# Patient Record
Sex: Male | Born: 1955 | Race: Black or African American | Hispanic: No | Marital: Married
Health system: Southern US, Community
[De-identification: ages and names within clinical notes are randomized; demographics above are authoritative.]

---

## 2004-07-02 ENCOUNTER — Emergency Department: Payer: Self-pay | Admitting: Unknown Physician Specialty

## 2004-07-03 ENCOUNTER — Ambulatory Visit: Payer: Self-pay | Admitting: Unknown Physician Specialty

## 2004-08-19 ENCOUNTER — Ambulatory Visit: Payer: Self-pay | Admitting: Vascular Surgery

## 2004-12-12 ENCOUNTER — Emergency Department: Payer: Self-pay | Admitting: Internal Medicine

## 2005-08-11 ENCOUNTER — Emergency Department: Payer: Self-pay | Admitting: Emergency Medicine

## 2005-09-04 ENCOUNTER — Ambulatory Visit: Payer: Self-pay | Admitting: Gastroenterology

## 2006-09-11 ENCOUNTER — Inpatient Hospital Stay: Payer: Self-pay | Admitting: Internal Medicine

## 2006-09-11 ENCOUNTER — Other Ambulatory Visit: Payer: Self-pay

## 2006-10-15 ENCOUNTER — Ambulatory Visit: Payer: Self-pay | Admitting: Gastroenterology

## 2008-12-15 IMAGING — CT CT ABD-PELV W/O CM
1 of 2 series · 15 of 32 positions shown, 19 images · non-contrast
Comparison: none

REASON FOR EXAM: (1) abd pain, rm 1; (2) abd pain, rm 1
COMMENTS:

PROCEDURE:     CT  - CT ABDOMEN AND PELVIS W[DATE]  [DATE]
RESULT:     Comparison: CT of the abdomen and pelvis on 07/03/2004.
TECHNIQUE: CT examination of the abdomen and pelvis was performed without
contrast. Collimation is 3 mm.

[Series 2: stone · axial · 0.69mm/px · z∈[-496,-80]mm · 15 of 156 slices shown, 19 images]
[im 11/156  soft-tissue]
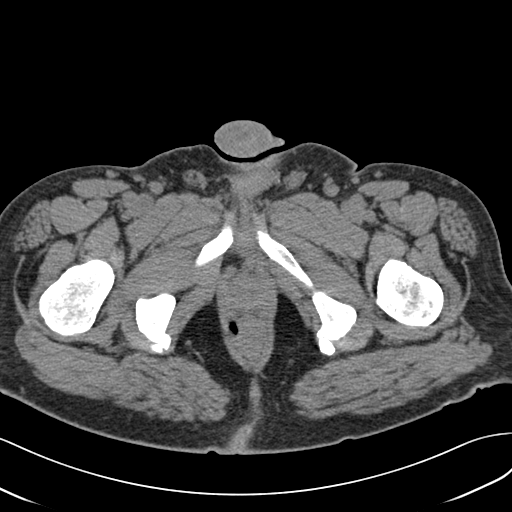
[im 11/156  bone]
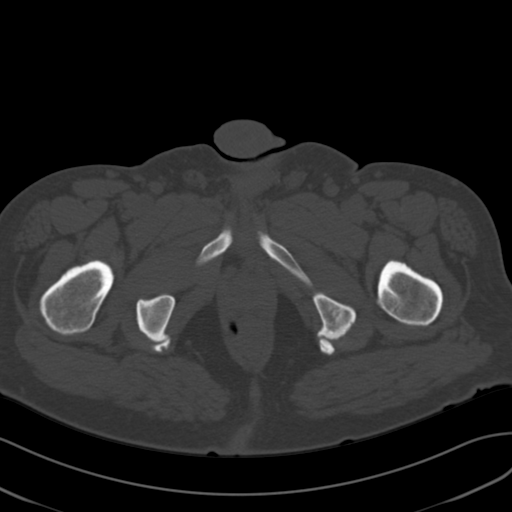
[im 22/156  soft-tissue]
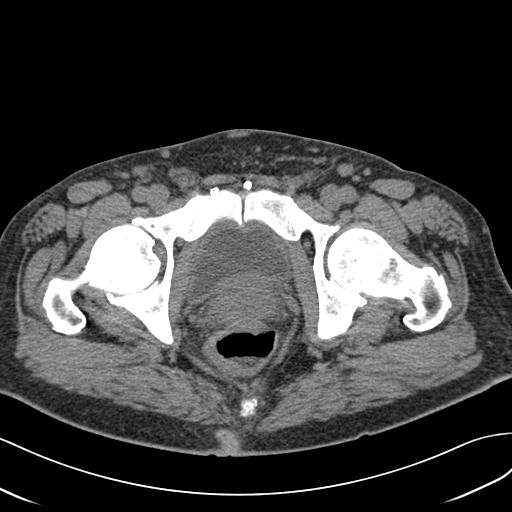
[im 33/156  soft-tissue]
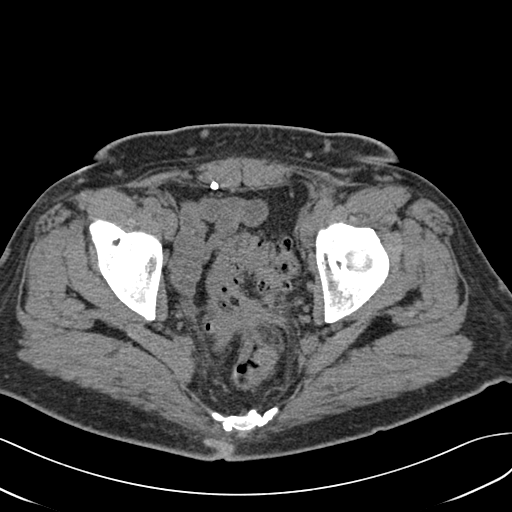
[im 43/156  soft-tissue]
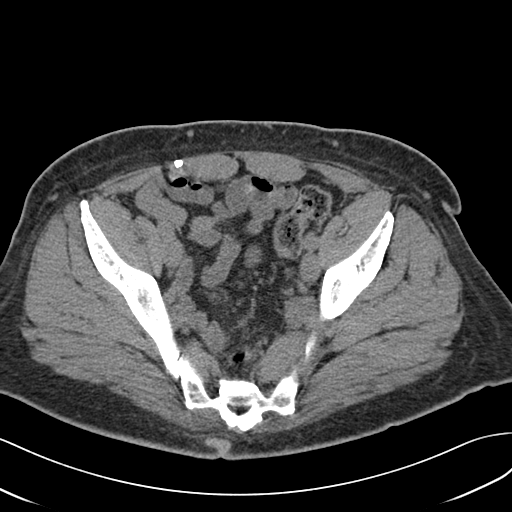
[im 54/156  soft-tissue]
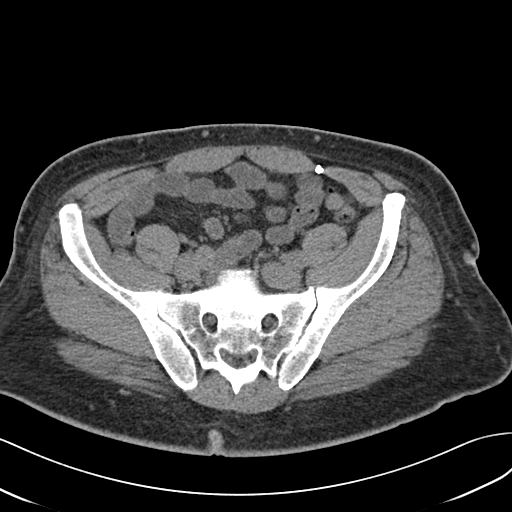
[im 65/156  soft-tissue]
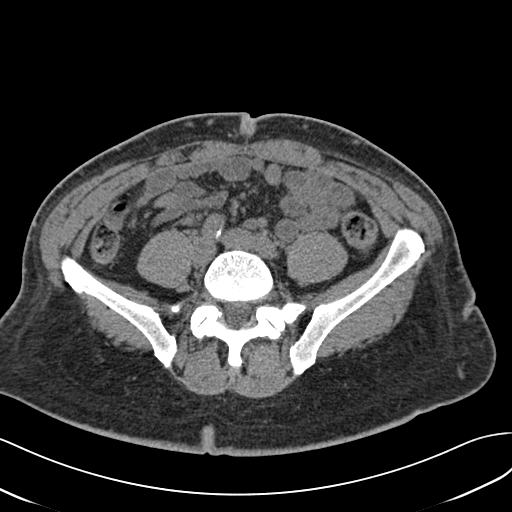
[im 81/156  soft-tissue]
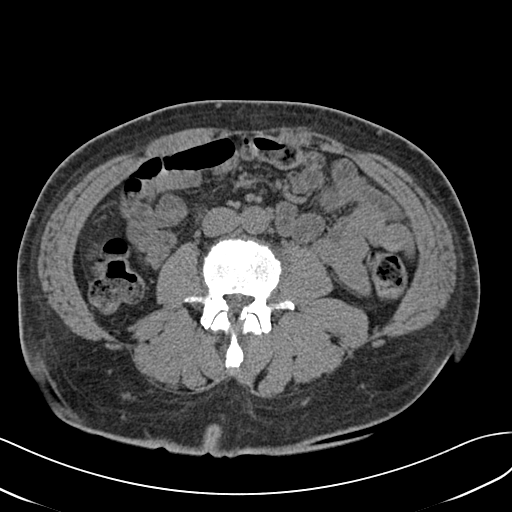
[im 91/156  soft-tissue]
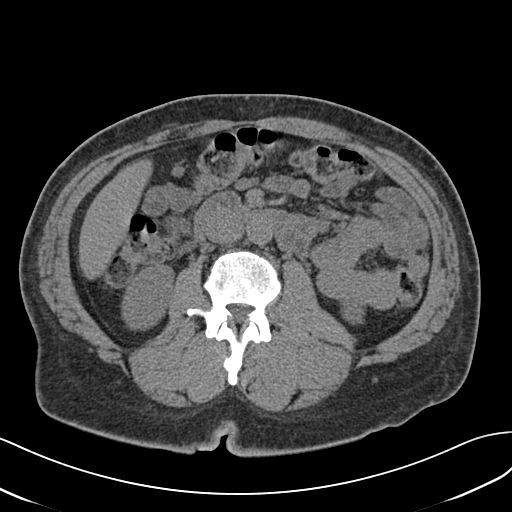
[im 102/156  soft-tissue]
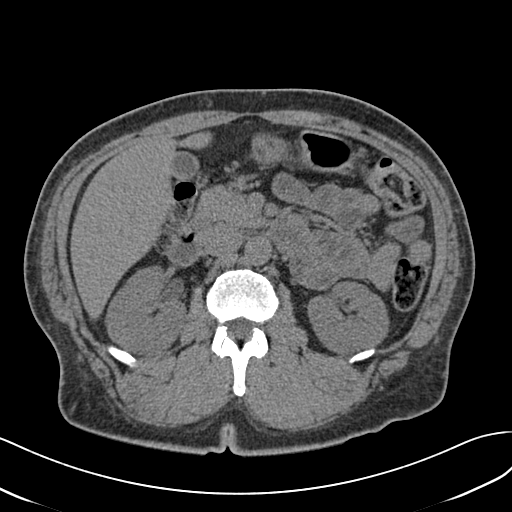
[im 102/156  bone]
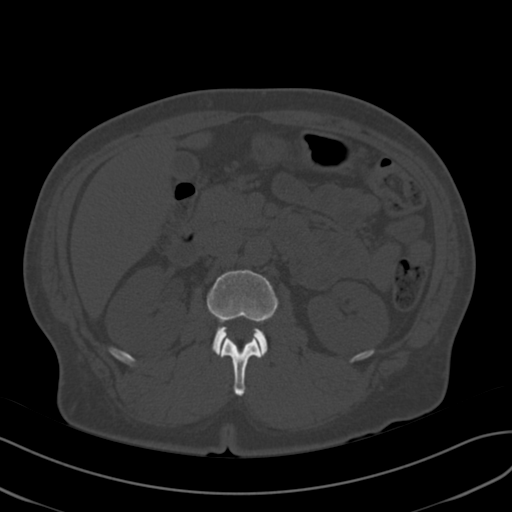
[im 113/156  soft-tissue]
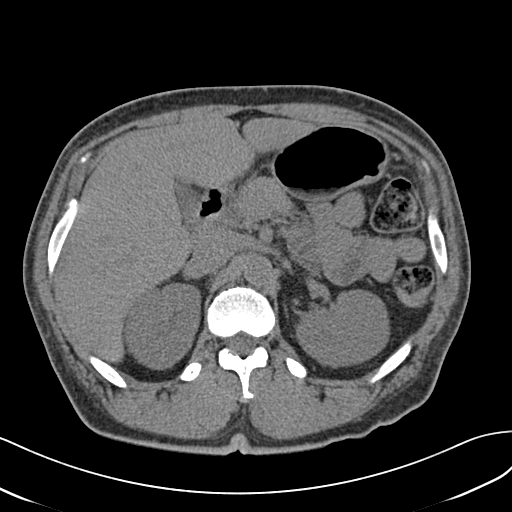
[im 123/156  soft-tissue]
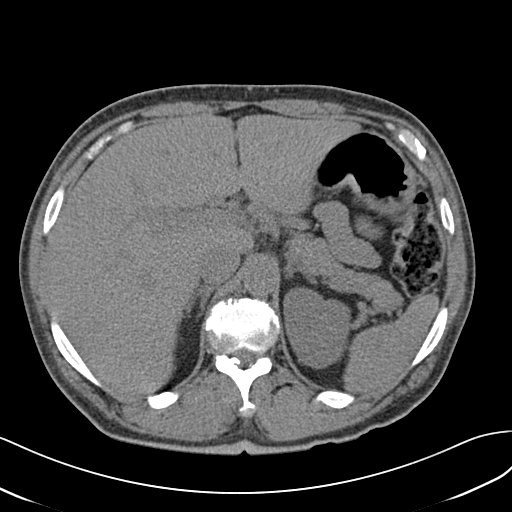
[im 134/156  soft-tissue]
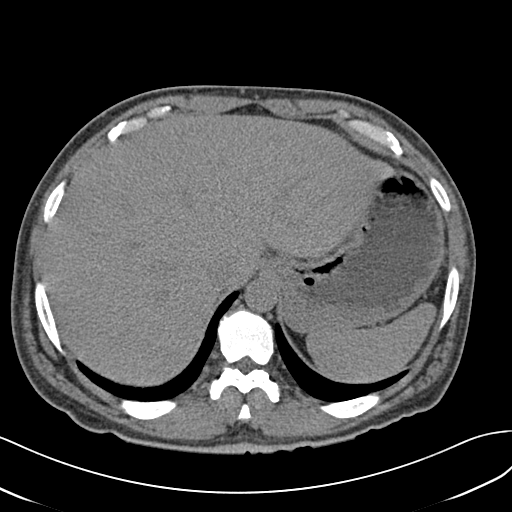
[im 134/156  lung]
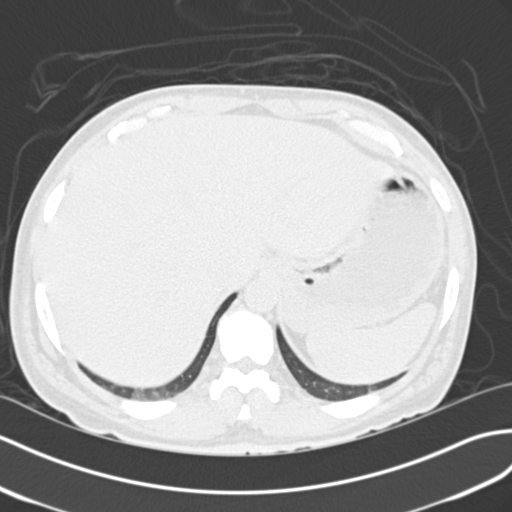
[im 139/156  lung]
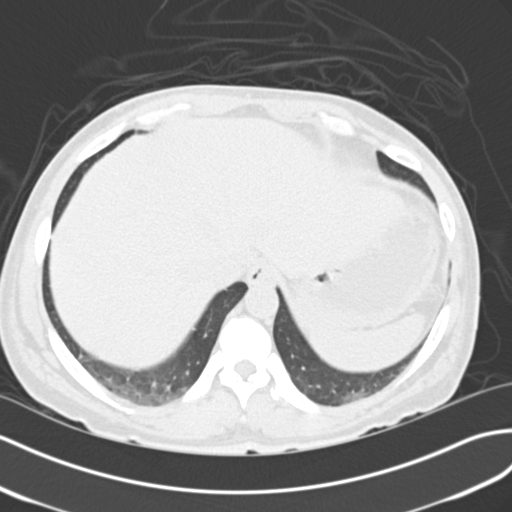
[im 145/156  soft-tissue]
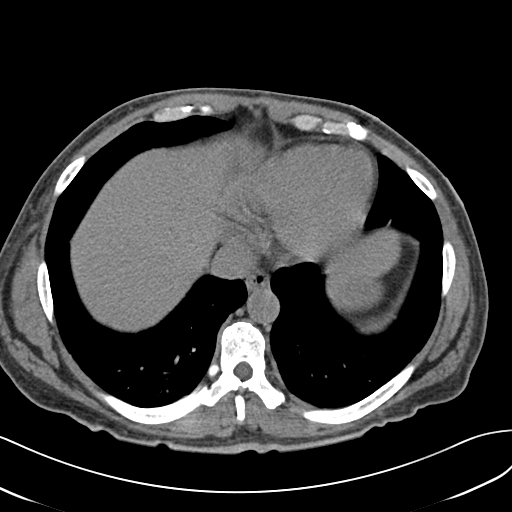
[im 145/156  lung]
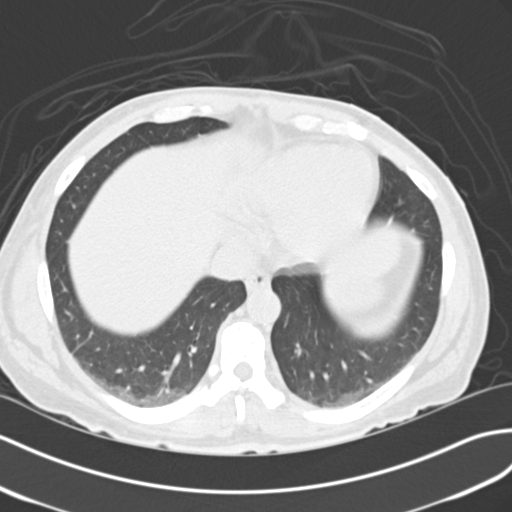
[im 150/156  lung]
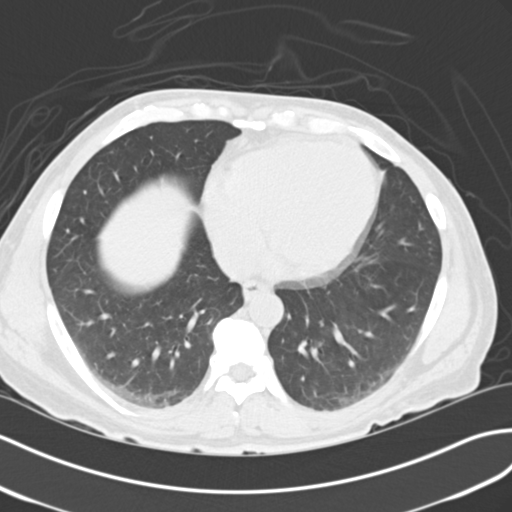

[15 of 32 positions shown; findings below may reference images not displayed]

FINDINGS: Limited evaluation of the lung bases shows minimal bibasilar atelectasis.

Evaluation of the abdominal organs, bowels, and vessels is limited without
contrast. The liver, gallbladder, spleen, pancreas, and adrenal glands are
grossly unremarkable. No renal or ureteral stone is noted. There is no
hydronephrosis.

Colonic diverticulosis is seen. There is fat stranding in the region of the
sigmoid colon. There is probably a punctate focus of extraluminal gas.
Findings are suggestive of diverticulitis with micro perforation. There is
no gross intraperitoneal free air. Presence or absence of an abscess is not
well evaluated without intravenous contrast. There is no dilatation of the
bowels. The appendix is unremarkable.  There are no enlarged abdominal
pelvic lymph nodes. Lower anterior abdominal/pelvic wall mesh is noted. No
ventral or inguinal hernia is seen.
IMPRESSION: 1. Findings are suggestive of sigmoid diverticulitis with microperforation.
Surrounding fat stranding and likely a small amount of complex fluid are
noted. Presence or absence of an abscess is not well evaluated without
intravenous contrast. If patient does not have a recent colonoscopy, would
recommend colonoscopy after acute management. Would also recommend follow
abdomen pelvis CT with contrast one month after acute management to see
resolution of extraluminal pelvic abnormality.

Preliminary report was faxed to the emergency room by the night radiologist
shortly after the study was performed.

## 2013-06-04 ENCOUNTER — Emergency Department: Payer: Self-pay | Admitting: Emergency Medicine

## 2017-07-26 ENCOUNTER — Encounter: Payer: Self-pay | Admitting: Emergency Medicine

## 2017-07-26 ENCOUNTER — Emergency Department: Payer: Self-pay

## 2017-07-26 ENCOUNTER — Emergency Department
Admission: EM | Admit: 2017-07-26 | Discharge: 2017-07-26 | Disposition: A | Discharge status: Home / Self Care | Payer: Self-pay | Attending: Emergency Medicine | Admitting: Emergency Medicine

## 2017-07-26 DIAGNOSIS — T148XXA Other injury of unspecified body region, initial encounter: Secondary | ICD-10-CM

## 2017-07-26 DIAGNOSIS — X509XXA Other and unspecified overexertion or strenuous movements or postures, initial encounter: Secondary | ICD-10-CM | POA: Insufficient documentation

## 2017-07-26 DIAGNOSIS — Y929 Unspecified place or not applicable: Secondary | ICD-10-CM | POA: Insufficient documentation

## 2017-07-26 DIAGNOSIS — M47896 Other spondylosis, lumbar region: Secondary | ICD-10-CM | POA: Insufficient documentation

## 2017-07-26 DIAGNOSIS — S39012A Strain of muscle, fascia and tendon of lower back, initial encounter: Secondary | ICD-10-CM | POA: Insufficient documentation

## 2017-07-26 DIAGNOSIS — Y93H2 Activity, gardening and landscaping: Secondary | ICD-10-CM | POA: Insufficient documentation

## 2017-07-26 DIAGNOSIS — Y998 Other external cause status: Secondary | ICD-10-CM | POA: Insufficient documentation

## 2017-07-26 LAB — URINALYSIS, COMPLETE (UACMP) WITH MICROSCOPIC
BACTERIA UA: NONE SEEN
BILIRUBIN URINE: NEGATIVE
Glucose, UA: NEGATIVE mg/dL
HGB URINE DIPSTICK: NEGATIVE
Ketones, ur: NEGATIVE mg/dL
LEUKOCYTES UA: NEGATIVE
NITRITE: NEGATIVE
PROTEIN: NEGATIVE mg/dL
SQUAMOUS EPITHELIAL / LPF: NONE SEEN (ref 0–5)
Specific Gravity, Urine: 1.014 (ref 1.005–1.030)
pH: 5 (ref 5.0–8.0)

## 2017-07-26 MED ORDER — TRAMADOL HCL 50 MG PO TABS: 50.0000 mg | ORAL_TABLET | Freq: Two times a day (BID) | ORAL | 0 refills | Status: AC | PRN

## 2017-07-26 MED ORDER — KETOROLAC TROMETHAMINE 60 MG/2ML IM SOLN
60.0000 mg | Freq: Once | INTRAMUSCULAR | Status: AC
  Administered 2017-07-26: 60 mg via INTRAMUSCULAR
  Filled 2017-07-26: qty 2

## 2017-07-26 MED ORDER — MELOXICAM 15 MG PO TABS: 15.0000 mg | ORAL_TABLET | Freq: Every day | ORAL | 0 refills | Status: AC

## 2017-07-26 MED ORDER — CYCLOBENZAPRINE HCL 10 MG PO TABS: 10.0000 mg | ORAL_TABLET | Freq: Three times a day (TID) | ORAL | 0 refills | Status: AC | PRN

## 2017-07-26 NOTE — ED Triage Notes (Signed)
Pt reports started with lower back pain on the right side Saturday after he finished weed eating. Pt denies urinary sx's. Denies obvious injuries.

## 2017-07-26 NOTE — ED Notes (Signed)
Pt c/o lower back pain since Saturday, states it started after weedeating..Marland Kitchen

## 2017-07-26 NOTE — Discharge Instructions (Addendum)
Advised elastic lumbar support when performing bending /lifting.

## 2017-07-26 NOTE — ED Provider Notes (Signed)
[facility] Emergency Department Provider Note   ____________________________________________   First MD Initiated Contact with Patient 07/26/17 1408     (approximate)  I have reviewed the triage vital signs and the nursing notes.   HISTORY  Chief Complaint Back Pain    HPI Dominic Stanley is a 62 y.o. male patient complain of right low back pain secondary to [employer] a weed eater 2 days ago.  Patient states pain started after he finished weed eating.  Patient the pain has progressed so that he has problems with sitting and standing.  Patient denies radicular component to his back pain.  Patient denies bladder bowel dysfunction.  No palliative measures for complaint.  Patient rates pain as 10/10.  Patient described the pain is "achy".  History reviewed. No pertinent past medical history.  There are no active problems to display for this patient.   History reviewed. No pertinent surgical history.  Prior to Admission medications   Medication Sig Start Date End Date Taking? Authorizing Provider  cyclobenzaprine (FLEXERIL) 10 MG tablet Take 1 tablet (10 mg total) by mouth 3 (three) times daily as needed. 07/26/17   Joni Reining, PA-C  meloxicam (MOBIC) 15 MG tablet Take 1 tablet (15 mg total) by mouth daily. 07/26/17   Joni Reining, PA-C  traMADol (ULTRAM) 50 MG tablet Take 1 tablet (50 mg total) by mouth every 12 (twelve) hours as needed. 07/26/17   Joni Reining, PA-C    Allergies Patient has no known allergies.  No family history on file.  Social History Social History   Tobacco Use  . Smoking status: Not on file  Substance Use Topics  . Alcohol use: Not on file  . Drug use: Not on file    Review of Systems Constitutional: No fever/chills Eyes: No visual changes. ENT: No sore throat. Cardiovascular: Denies chest pain. Respiratory: Denies shortness of breath. Gastrointestinal: No abdominal pain.  No nausea, no vomiting.  No  diarrhea.  No constipation. Genitourinary: Negative for dysuria. Musculoskeletal: Positive for back pain. Skin: Negative for rash. Neurological: Negative for headaches, focal weakness or numbness.   ____________________________________________   PHYSICAL EXAM:  VITAL SIGNS: ED Triage Vitals  Enc Vitals Group     BP 07/26/17 1349 135/85     Pulse Rate 07/26/17 1349 82     Resp 07/26/17 1349 20     Temp 07/26/17 1350 98.8 F (37.1 C)     Temp Source 07/26/17 1350 Oral     SpO2 07/26/17 1349 98 %     Weight 07/26/17 1349 201 lb (91.2 kg)     Height 07/26/17 1349 5\' 6"  (1.676 m)     Head Circumference --      Peak Flow --      Pain Score 07/26/17 1349 10     Pain Loc --      Pain Edu? --      Excl. in GC? --    Constitutional: Alert and oriented. Well appearing and in no acute distress.  Neck:No cervical spine tenderness to palpation. Cardiovascular: Normal rate, regular rhythm. Grossly normal heart sounds.  Good peripheral circulation. Respiratory: Normal respiratory effort.  No retractions. Lungs CTAB. Gastrointestinal: Soft and nontender. No distention. No abdominal bruits. No CVA tenderness. Genitourinary: Deferred Musculoskeletal: Patient sits and stands with reliance on the upper extremities.  No obvious spinal deformity.  No lower extremity tenderness nor edema. Neurologic:  Normal speech and language. No gross focal neurologic deficits are appreciated.  No gait instability. Skin:  Skin is warm, dry and intact. No rash noted. Psychiatric: Mood and affect are normal. Speech and behavior are normal.  ____________________________________________   LABS (all labs ordered are listed, but only abnormal results are displayed)  Labs Reviewed  URINALYSIS, COMPLETE (UACMP) WITH MICROSCOPIC - Abnormal; Notable for the following components:      Result Value   Color, Urine YELLOW (*)    APPearance CLEAR (*)    All other components within normal limits    ____________________________________________  EKG  EKG read by heart station Dr. with no acute findings. ____________________________________________  RADIOLOGY  ED MD interpretation:  Official radiology report(s): Dg Lumbar Spine 2-3 Views  Result Date: 07/26/2017 CLINICAL DATA:  Right-sided lower back pain EXAM: LUMBAR SPINE - 3 VIEW COMPARISON:  10/15/2006 FINDINGS: Five lumbar type vertebral bodies are well visualized. Mild scoliosis of the lumbar spine is seen. Disc space narrowing is noted at L2-3. Osteophytic changes are noted at all lumbar levels. No definitive anterolisthesis is seen. No soft tissue changes are noted. IMPRESSION: Multilevel degenerative change without acute abnormality. Electronically Signed   By: Alcide CleverMark  Lukens M.D.   On: 07/26/2017 14:50    ____________________________________________   PROCEDURES  Procedure(s) performed: None  Procedures  Critical Care performed: No  ____________________________________________   INITIAL IMPRESSION / ASSESSMENT AND PLAN / ED COURSE  As part of my medical decision making, I reviewed the following data within the electronic MEDICAL RECORD NUMBER    Patient presents with increasing right lateral back pain secondary to yardwork.  Discussed x-ray findings with patient showing moderate degenerative change in the lumbar spine.  Patient given discharge care instruction and prescription for Flexeril, tramadol and meloxicam.  Patient advised to wear elastic lumbar support when performing repetitive lifting and bending.  Advised [facility].      ____________________________________________   FINAL CLINICAL IMPRESSION(S) / ED DIAGNOSES  Final diagnoses:  Muscle strain  Other osteoarthritis of spine, lumbar region     ED Discharge Orders        Ordered    cyclobenzaprine (FLEXERIL) 10 MG tablet  3 times daily PRN     07/26/17 1503    traMADol (ULTRAM) 50 MG tablet   Every 12 hours PRN     07/26/17 1503    meloxicam (MOBIC) 15 MG tablet  Daily     07/26/17 1503       Note:  This document was prepared using Dragon voice recognition software and may include unintentional dictation errors.    Joni ReiningSmith, Tarrah Furuta K, PA-C 07/26/17 1515    Emily FilbertWilliams, Jonathan E, MD 07/27/17 1047

## 2017-07-26 NOTE — ED Notes (Signed)
See triage note  Presents with pain to right lower back  States pain started after weed eating his yard this past weekend.  Pain increases with movement

## 2019-10-30 IMAGING — CR DG LUMBAR SPINE 2-3V
1 series · 3 of 3 positions shown · non-contrast
Comparison: 10/15/2006

CLINICAL DATA: Right-sided lower back pain

EXAM:
LUMBAR SPINE - 3 VIEW

[Series 1: dg lumbar spine 2-3 views · 0.14mm/px · 3 of 3 slices shown]
[im 1/3]
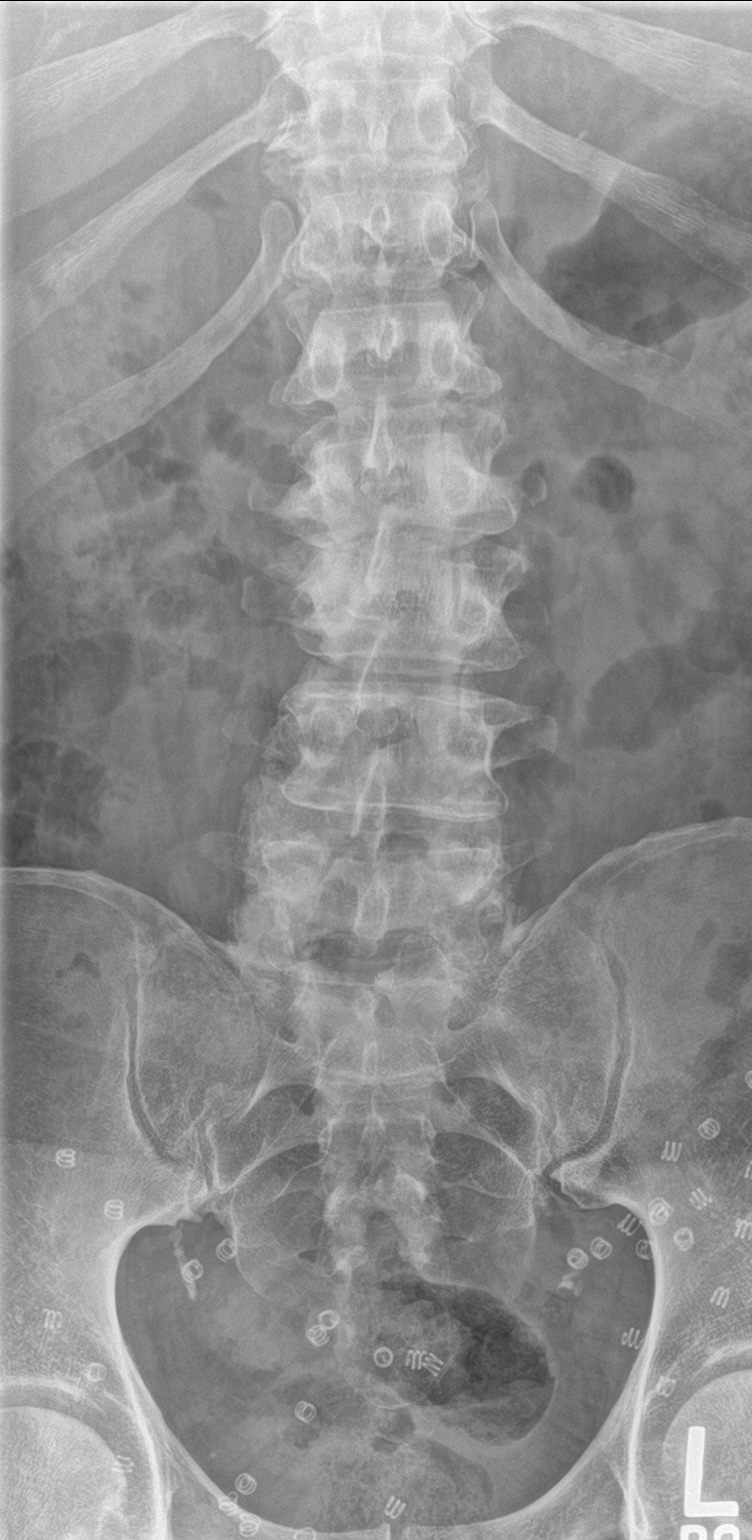
[im 2/3]
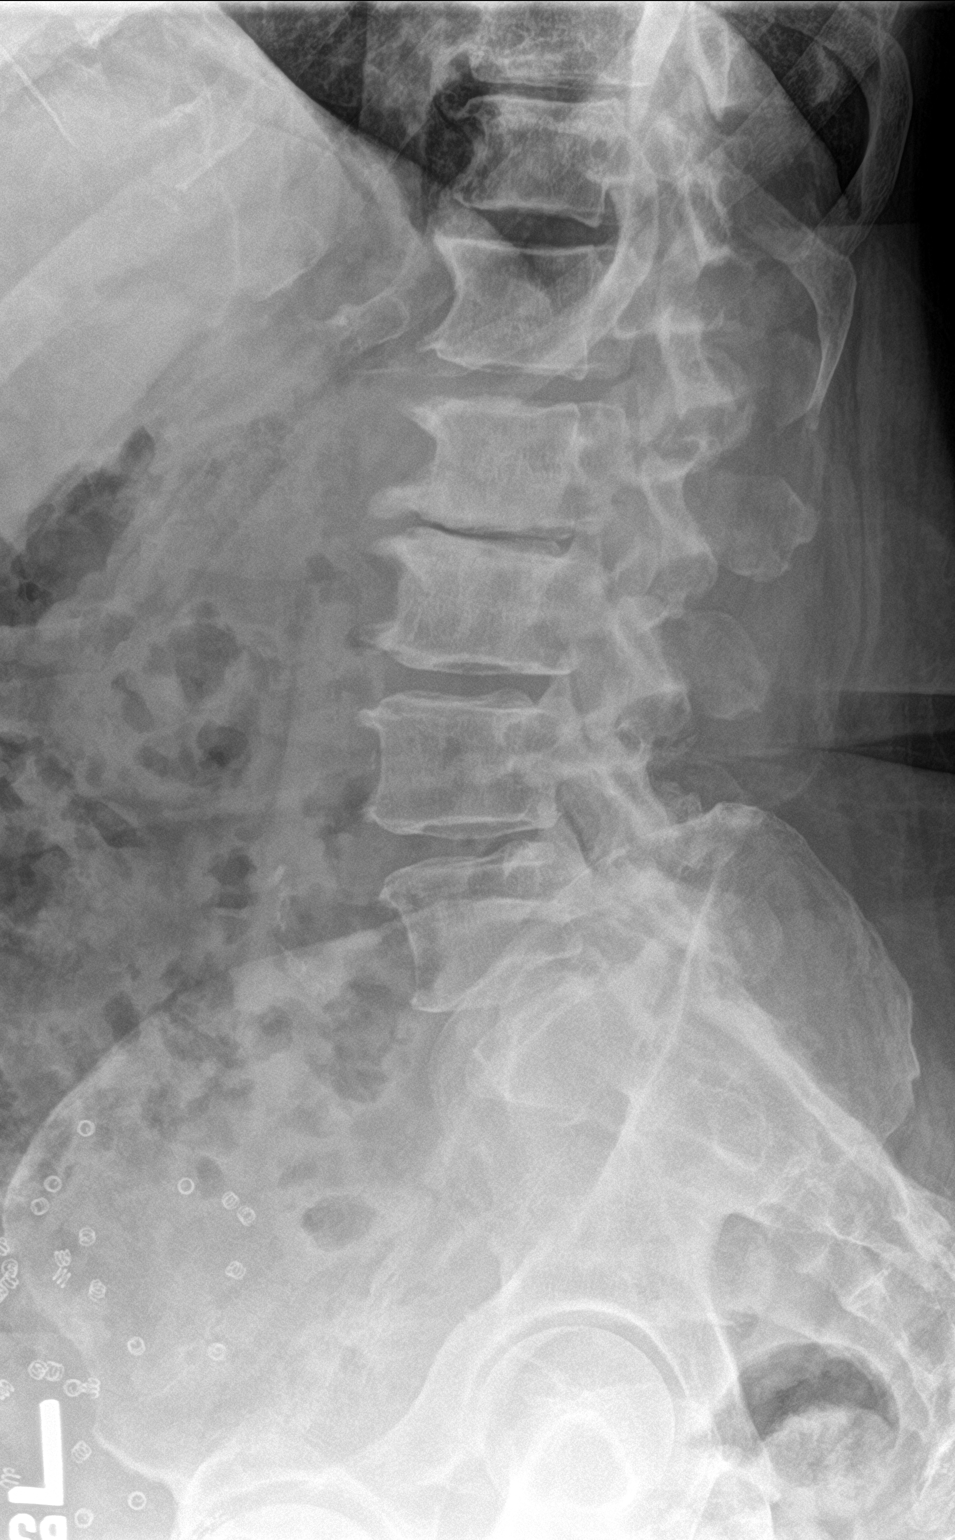
[im 3/3]
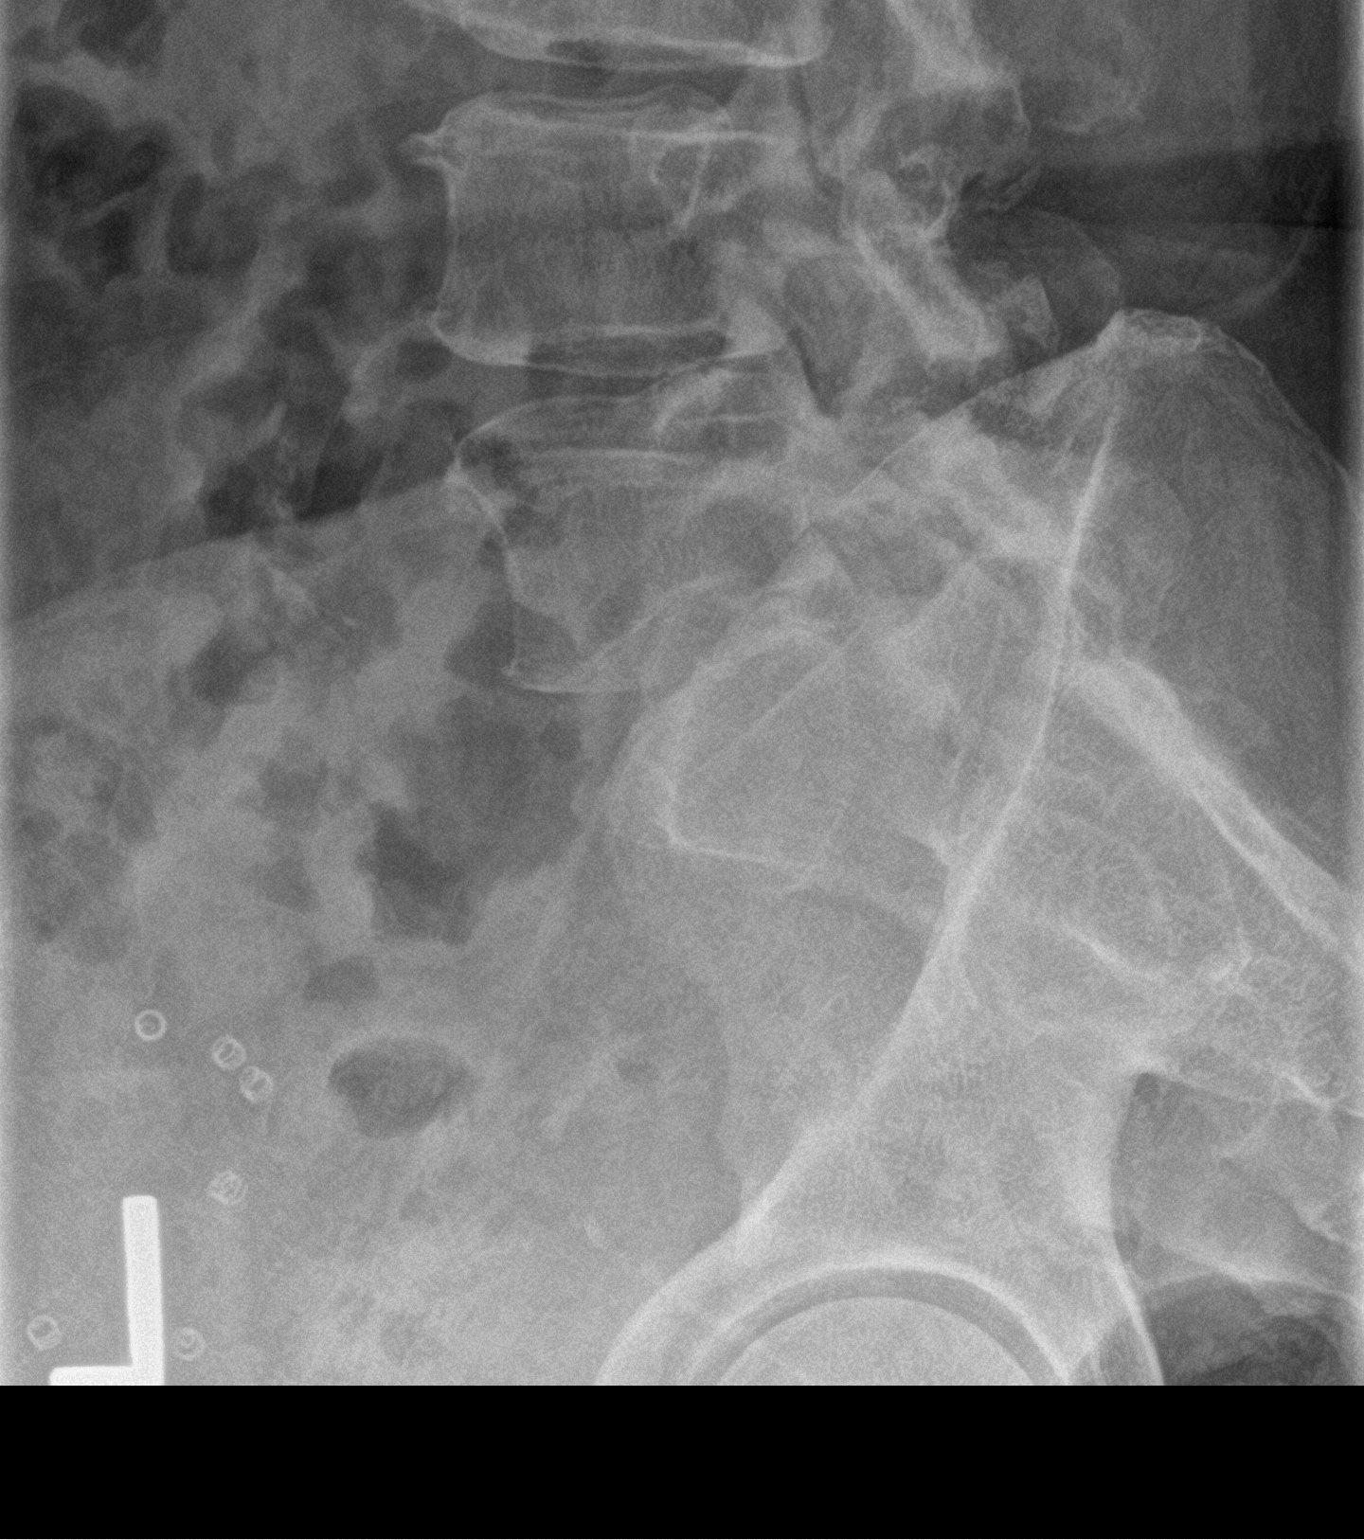

[3 of 3 positions shown; findings below may reference images not displayed]

FINDINGS: Five lumbar type vertebral bodies are well visualized. Mild
scoliosis of the lumbar spine is seen. Disc space narrowing is noted
at L2-3. Osteophytic changes are noted at all lumbar levels. No
definitive anterolisthesis is seen. No soft tissue changes are
noted.
IMPRESSION: Multilevel degenerative change without acute abnormality.

## 2022-01-14 ENCOUNTER — Ambulatory Visit: Payer: Medicare Other | Admitting: Physician Assistant

## 2022-01-14 NOTE — Progress Notes (Deleted)
    I,Sha'taria Kellianne Ek,acting as a Neurosurgeon for [employer], PA-C.,have documented all relevant documentation on the behalf of Debera Lat, PA-C,as directed by  [employer], PA-C while in the presence of [employer], PA-C.    New patient visit   Patient: Dominic Stanley   DOB: [birth year]   66 y.o. Male  MRN: [id number] Visit Date: 01/14/2022  Today's healthcare provider: Debera Lat, PA-C   No chief complaint on file.  Subjective    DELLAS GUARD is a 66 y.o. male who presents today as a new patient to establish care.  HPI  ***  No past medical history on file. No past surgical history on file. No family status information on file.   No family history on file. Social History   Socioeconomic History   Marital status: Married    Spouse name: Not on file   Number of children: Not on file   Years of education: Not on file   Highest education level: Not on file  Occupational History   Not on file  Tobacco Use   Smoking status: Not on file   Smokeless tobacco: Not on file  Substance and Sexual Activity   Alcohol use: Not on file   Drug use: Not on file   Sexual activity: Not on file  Other Topics Concern   Not on file  Social History Narrative   Not on file   Social Determinants of Health   Financial Resource Strain: Not on file  Food Insecurity: Not on file  Transportation Needs: Not on file  Physical Activity: Not on file  Stress: Not on file  Social Connections: Not on file   Outpatient Medications Prior to Visit  Medication Sig   cyclobenzaprine (FLEXERIL) 10 MG tablet Take 1 tablet (10 mg total) by mouth 3 (three) times daily as needed.   meloxicam (MOBIC) 15 MG tablet Take 1 tablet (15 mg total) by mouth daily.   traMADol (ULTRAM) 50 MG tablet Take 1 tablet (50 mg total) by mouth every 12 (twelve) hours as needed.   No facility-administered medications prior to visit.   No Known Allergies   There is no immunization history on file for  this patient.  Health Maintenance  Topic Date Due   Medicare Annual Wellness (AWV)  Never done   COVID-19 Vaccine (1) Never done   HIV Screening  Never done   Hepatitis C Screening  Never done   DTaP/Tdap/Td (1 - Tdap) Never done   COLONOSCOPY (Pts 45-1yrs Insurance coverage will need to be confirmed)  Never done   Zoster Vaccines- Shingrix (1 of 2) Never done   Pneumonia Vaccine 41+ Years old (1 - PCV) Never done   INFLUENZA VACCINE  Never done   HPV VACCINES  Aged Out    Patient Care Team: Patient, No Pcp Per as PCP - General (General Practice)  Review of Systems  {Labs  Heme  Chem  Endocrine  Serology  Results Review (optional):23779}   Objective    There were no vitals taken for this visit. {Show previous vital signs (optional):23777}  Physical Exam ***  Depression Screen     No data to display         No results found for any visits on 01/14/22.  Assessment & Plan     ***  No follow-ups on file.     {provider attestation***:1}   Debera Lat, Cordelia Poche  [facility] [phone] (phone) [phone] (fax)  [facility]
